# Patient Record
Sex: Female | Born: 1985 | Race: White | Hispanic: No | Marital: Single | Smoking: Never smoker
Health system: Southern US, Community
[De-identification: ages and names within clinical notes are randomized; demographics above are authoritative.]

## PROBLEM LIST (undated history)

## (undated) HISTORY — PX: NO PAST SURGERIES: SHX2092

---

## 2003-01-23 ENCOUNTER — Inpatient Hospital Stay (HOSPITAL_COMMUNITY): Admission: AD | Admit: 2003-01-23 | Discharge: 2003-01-25 | Payer: Self-pay | Admitting: Obstetrics and Gynecology

## 2008-01-06 ENCOUNTER — Other Ambulatory Visit: Admission: RE | Admit: 2008-01-06 | Discharge: 2008-01-06 | Payer: Self-pay | Admitting: Obstetrics and Gynecology

## 2008-04-18 ENCOUNTER — Emergency Department (HOSPITAL_COMMUNITY): Admission: EM | Admit: 2008-04-18 | Discharge: 2008-04-18 | Payer: Self-pay | Admitting: Emergency Medicine

## 2011-02-02 NOTE — H&P (Signed)
NAME:  Sara Newman, Sara Newman                       ACCOUNT NO.:  [id number]   MEDICAL RECORD NO.:  [id number]                   PATIENT TYPE:  INP   LOCATION:  A428                                 FACILITY:  [facility]   PHYSICIAN:  Langley Gauss, M.D.                DATE OF BIRTH:  Feb 26, 1986   DATE OF ADMISSION:  01/23/2003  DATE OF DISCHARGE:                                HISTORY & PHYSICAL   HISTORY OF PRESENT ILLNESS:  This is an 25 year old gravida 1, para 0 at 40+  weeks gestation who presents to [facility] in early stages of  labor.  The patient was initially examined by the nursing staff at 1800 at  which time she is noted to be dilated 3 cm with uterine contractions of  every 3-5 minutes and intact membranes.   The patient presented at [facility] with increase in frequency and  intensity of uterine contractions throughout the day.  She denies any  vaginal bleeding or any history which would be consistent with rupture of  membranes.  The patient's prenatal course has been uncomplicated.  She has  received prenatal care through the office of Dr. Christin Bach.  She  reportedly had a negative benign prenatal course. She is noted to be GBS  negative.  A 3-hour glucose tolerance test was within normal limits.   PAST MEDICAL HISTORY:  The patient has no other medical or surgical  illnesses.  She has never required hospitalization.  She does have an  abnormal ASCUS Pap smear with the recommendation that this Pap smear be  repeated 3 months postdelivery.   ALLERGIES:  The patient has no known drug allergies.   PHYSICAL EXAMINATION:  GENERAL:  A pleasant, white female, 25 years old.  VITAL SIGNS:  The blood pressure is 117/73, pulse rate of 68, respiratory  rate is 20, temperature is 97.9.  HEENT:  Negative.  NECK:  No adenopathy.  Neck is supple.  Thyroid is not palpable.  LUNGS:  Clear.  CARDIOVASCULAR:  Exam is regular rate and rhythm.  ABDOMEN:  The abdomen is  soft and nontender.  No surgical scars are  identified.  The patient is vertex presentation by Leopold's maneuvers  EXTREMITIES:  Extremities are noted to be normal.  PELVIC:  No vaginal bleeding or leakage of fluid.  External genitalia within  normal limits.  Visual examination reveals the cervix to be 3 cm dilated  with bulging, intact membranes per the nursing staff.  There is a reassuring  fetal heart rate by an external fetal monitor and contracting every 3-5  minutes.   ASSESSMENT:  A 40+ week intrauterine pregnancy now presenting in the early  stages of labor.  We will proceed with amniotomy; thereafter, augment or  induce with Pitocin as clinically indicated  The patient will be observed  for active labor after amniotomy.  The patient is interested in placement of  an epidural during the course of labor.                                               Langley Gauss, M.D.   DC/MEDQ  D:  01/24/2003  T:  01/24/2003  Job:  [id number]

## 2011-02-02 NOTE — Op Note (Signed)
NAME:  Sara Newman, SOHAIL NO.:  [id number]   MEDICAL RECORD NO.:  [id number]                  PATIENT TYPE:   LOCATION:                                       FACILITY:   PHYSICIAN:  Langley Gauss, M.D.                DATE OF BIRTH:   DATE OF PROCEDURE:  DATE OF DISCHARGE:                                 OPERATIVE REPORT   DIAGNOSES:  A 40+ week intrauterine pregnancy presenting in labor.   PROCEDURES PERFORMED:  1. Placement of continuous lumbar epidural analgesia.  2. Spontaneous assisted vaginal delivery of a 7 pound 14 ounce female     infant.  3. Midline episiotomy repair.   Delivery performed by Dr. Roylene Reason. Lisette Grinder.   ESTIMATED BLOOD LOSS:  Less than 500 cc.   SPECIMENS:  1. Arterial cord gas and cord blood to pathology.  2. Placenta is examined and noted to be apparently intake with a 3-vessel     umbilical cord.   SUMMARY:  The patient had been admitted in active labor with rapid  progression through the course of labor.  The patient requested an epidural  analgesic which was placed without difficulty and provided excellent  bilateral relief.  The patient subsequently reached complete dilatation. She  had such an excellent epidural block in place that she had no urge to push  nor was she able to perceive her contractions at all.  Thus, the epidural  pump was turned off until the patient began having return of sensation to  push.   The patient is, thus, placed in the dorsal lithotomy position.  At that time  the Foley catheter is removed.  The patient did have a terminal deceleration  of the fetal heart rate to 60 beats/min times the final 4 minutes.  With  distention of the perineum a small midline episiotomy is performed.  The  infant is delivered in an OA position with the midline episiotomy without  extension.  The mouth and nares were bulb suctioned of clear amniotic fluid.  Renewed expulsive efforts resulted in a spontaneous  rotation to a right  anterior shoulder position.  Gentle downward traction combined with  expulsive efforts resulted in delivery of this shoulder beneath the pubic  symphysis without difficulty.  The remainder of the infant likewise  delivered without difficulty.  The umbilical cord milked towards the infant.  The cord was doubly clamped, and cut.  The infant was placed on the maternal  abdomen for immediate bonding purposes.  Arterial cord gases and cord blood  then obtained.   Gentle traction of the umbilical cord results in separation which, upon  examination, appears to be an intact 3-vessel cord.  Examination of the  genital tract reveals the midline episiotomy. This did not extend.  This was  easily repaired  utilizing #0 chromic in a running lock suture on the vaginal mucosa and a 2-  layer  closure of #0 chromic on the perineal body.  The patient tolerated the  procedure well.  She was taken out of the dorsal lithotomy position and  rolled to her side at which time the epidural catheter is removed with the  blue tip noted to be intact.                                               Langley Gauss, M.D.    DC/MEDQ  D:  01/24/2003  T:  01/24/2003  Job:  [id number]

## 2011-02-02 NOTE — Op Note (Signed)
   NAME:  Sara Newman, Sara Newman NO.:  [id number]   MEDICAL RECORD NO.:  [id number]                  PATIENT TYPE:   LOCATION:                                       FACILITY:   PHYSICIAN:  Langley Gauss, M.D.                DATE OF BIRTH:   DATE OF PROCEDURE:  DATE OF DISCHARGE:                                 OPERATIVE REPORT   OBSTETRICAL PROCEDURE NOTE:   PROCEDURE:  Placement of continuous lumbar epidural analgesia at the L3-L4  interspace performed by Dr. Roylene Reason. Lisette Grinder.   COMPLICATIONS:  None.   SUMMARY:  An appropriate and informed consent obtained.  Continuous  electronic fetal monitoring performed. The patient was placed in the seated  position; bony landmarks were identified.  The L3-L4 interspace was chosen.  The patient's back is sterilely prepped and draped utilizing the epidural  kit; 5 cc of 1% lidocaine plain injected at the midline of the L3-L4  interspace to raise this site to raise a small skin wheal.   The 17-gauge Touhy-Schliff needle was then utilized with loss of resistance  in air-filled glass syringe to identify entry into the epidural space on the  first attempt without difficulty.  Initial test dose of 5 cc of 1.5%  lidocaine plus epinephrine injected through the epidural needle.  No signs  of CSF or intravascular injection obtained.  Epidural catheter is then  inserted to a depth of 5 cm; needle is removed.  Aspiration test is  negative.  Second test dose of 2 cc of 1.5% lidocaine plus epinephrine  injected through the epidural catheter.  Again, no signs of CSF or  intravascular injection obtained.   Thus, the patient is connected to the infusion pump, containing the standard  mixture.  She will be treated with a 10 cc bolus followed by continuous  infusion rate of 14 cc/hour.  Upon return to the left lateral supine  position the patient does have evidence of a bilateral epidural block  setting up with good relief for  labor analgesia.   The patient had been examined immediately prior to placement of the epidural  and noted to be 6 cm dilated, completely effaced and 0 station with  reassuring fetal heart rate.  Amniotomy had been performed with findings of  clear amniotic fluid and a fetal scalp electrode placed.                                               Langley Gauss, M.D.    DC/MEDQ  D:  01/23/2003  T:  01/24/2003  Job:  [id number]   cc:   [facility]

## 2011-06-04 ENCOUNTER — Other Ambulatory Visit: Payer: Self-pay | Admitting: Family Medicine

## 2011-06-04 ENCOUNTER — Ambulatory Visit (HOSPITAL_COMMUNITY)
Admission: RE | Admit: 2011-06-04 | Discharge: 2011-06-04 | Disposition: A | Discharge status: Home / Self Care | Payer: Medicaid Other | Source: Ambulatory Visit | Attending: Family Medicine | Admitting: Family Medicine

## 2011-06-04 DIAGNOSIS — M7730 Calcaneal spur, unspecified foot: Secondary | ICD-10-CM | POA: Insufficient documentation

## 2011-06-04 DIAGNOSIS — S99929A Unspecified injury of unspecified foot, initial encounter: Secondary | ICD-10-CM | POA: Insufficient documentation

## 2011-06-04 DIAGNOSIS — R52 Pain, unspecified: Secondary | ICD-10-CM

## 2011-06-04 DIAGNOSIS — M25579 Pain in unspecified ankle and joints of unspecified foot: Secondary | ICD-10-CM | POA: Insufficient documentation

## 2011-06-04 DIAGNOSIS — S8990XA Unspecified injury of unspecified lower leg, initial encounter: Secondary | ICD-10-CM | POA: Insufficient documentation

## 2011-06-04 DIAGNOSIS — X500XXA Overexertion from strenuous movement or load, initial encounter: Secondary | ICD-10-CM | POA: Insufficient documentation

## 2016-10-12 ENCOUNTER — Ambulatory Visit
Admission: EM | Admit: 2016-10-12 | Discharge: 2016-10-12 | Disposition: A | Discharge status: Home / Self Care | Payer: Managed Care, Other (non HMO) | Attending: Family Medicine | Admitting: Family Medicine

## 2016-10-12 DIAGNOSIS — N3000 Acute cystitis without hematuria: Secondary | ICD-10-CM | POA: Diagnosis not present

## 2016-10-12 LAB — URINALYSIS, COMPLETE (UACMP) WITH MICROSCOPIC
GLUCOSE, UA: NEGATIVE mg/dL
Leukocytes, UA: NEGATIVE
Nitrite: POSITIVE — AB
pH: 5 (ref 5.0–8.0)

## 2016-10-12 MED ORDER — NITROFURANTOIN MONOHYD MACRO 100 MG PO CAPS: 100.0000 mg | ORAL_CAPSULE | Freq: Two times a day (BID) | ORAL | 0 refills | Status: DC

## 2016-10-12 NOTE — ED Triage Notes (Signed)
Patient states that she started UTI symptoms x 1 month. Patient states that she took AZO mulitple times and still came back. Patient states that she has burning with urination and frequency. Patient states that she has also had some lower abdominal pain.

## 2016-10-12 NOTE — ED Provider Notes (Signed)
[facility]    CSN: [id number] Arrival date & time: 10/12/16  1327     History   Chief Complaint Chief Complaint  Patient presents with  . Urinary Tract Infection    HPI Sara Newman is a 31 y.o. female.    Urinary Tract Infection  Pain quality:  Burning Pain severity:  Mild Onset quality:  Sudden Timing:  Constant Progression:  Waxing and waning Chronicity:  New Recent urinary tract infections: no   Relieved by:  Nothing Ineffective treatments:  Phenazopyridine and cranberry juice Urinary symptoms: discolored urine and frequent urination   Associated symptoms: abdominal pain (mild; suprapubic)   Associated symptoms: no fever, no flank pain, no genital lesions, no nausea, no vaginal discharge and no vomiting   Risk factors: no hx of pyelonephritis, no hx of urolithiasis, no kidney transplant, not pregnant, no recurrent urinary tract infections, no renal cysts, no renal disease, no single kidney and no urinary catheter     History reviewed. No pertinent past medical history.  There are no active problems to display for this patient.   Past Surgical History:  Procedure Laterality Date  . NO PAST SURGERIES      OB History    No data available       Home Medications    Prior to Admission medications   Medication Sig Start Date End Date Taking? Authorizing Provider  nitrofurantoin, macrocrystal-monohydrate, (MACROBID) 100 MG capsule Take 1 capsule (100 mg total) by mouth 2 (two) times daily. 10/12/16   Payton Mccallumrlando Felipe Cabell, MD    Family History History reviewed. No pertinent family history.  Social History Social History  Substance Use Topics  . Smoking status: Never Smoker  . Smokeless tobacco: Never Used  . Alcohol use Yes     Allergies   Patient has no known allergies.   Review of Systems Review of Systems  Constitutional: Negative for fever.  Gastrointestinal: Positive for abdominal pain (mild; suprapubic). Negative for nausea and  vomiting.  Genitourinary: Negative for flank pain and vaginal discharge.     Physical Exam Triage Vital Signs ED Triage Vitals  Enc Vitals Group     BP 10/12/16 1414 113/77     Pulse Rate 10/12/16 1414 87     Resp 10/12/16 1414 18     Temp 10/12/16 1414 98.6 F (37 C)     Temp Source 10/12/16 1414 Oral     SpO2 10/12/16 1414 100 %     Weight 10/12/16 1406 117 lb (53.1 kg)     Height 10/12/16 1406 5\' 10"  (1.778 m)     Head Circumference --      Peak Flow --      Pain Score 10/12/16 1414 3     Pain Loc --      Pain Edu? --      Excl. in GC? --    No data found.   Updated Vital Signs BP 113/77 (BP Location: Left Arm)   Pulse 87   Temp 98.6 F (37 C) (Oral)   Resp 18   Ht 5\' 10"  (1.778 m)   Wt 117 lb (53.1 kg)   LMP 09/25/2015   SpO2 100%   BMI 16.79 kg/m   Visual Acuity Right Eye Distance:   Left Eye Distance:   Bilateral Distance:    Right Eye Near:   Left Eye Near:    Bilateral Near:     Physical Exam  Constitutional: She appears well-developed and well-nourished. No distress.  Abdominal:  Soft. Bowel sounds are normal. She exhibits no distension and no mass. There is tenderness (mild; suprapubic). There is no rebound and no guarding.  Skin: She is not diaphoretic.  Nursing note and vitals reviewed.    UC Treatments / Results  Labs (all labs ordered are listed, but only abnormal results are displayed) Labs Reviewed  URINALYSIS, COMPLETE (UACMP) WITH MICROSCOPIC - Abnormal; Notable for the following:       Result Value   APPearance HAZY (*)    Specific Gravity, Urine >1.030 (*)    Hgb urine dipstick TRACE (*)    Bilirubin Urine SMALL (*)    Ketones, ur TRACE (*)    Protein, ur TRACE (*)    Nitrite POSITIVE (*)    Squamous Epithelial / LPF 0-5 (*)    Bacteria, UA MANY (*)    All other components within normal limits    EKG  EKG Interpretation None       Radiology No results found.  Procedures Procedures (including critical care  time)  Medications Ordered in UC Medications - No data to display   Initial Impression / Assessment and Plan / UC Course  I have reviewed the triage vital signs and the nursing notes.  Pertinent labs & imaging results that were available during my care of the patient were reviewed by me and considered in my medical decision making (see chart for details).       Final Clinical Impressions(s) / UC Diagnoses   Final diagnoses:  Acute cystitis without hematuria    New Prescriptions Discharge Medication List as of 10/12/2016  3:04 PM    START taking these medications   Details  nitrofurantoin, macrocrystal-monohydrate, (MACROBID) 100 MG capsule Take 1 capsule (100 mg total) by mouth 2 (two) times daily., Starting Fri 10/12/2016, Normal       1. Lab results and diagnosis reviewed with patient 2. rx as per orders above; reviewed possible side effects, interactions, risks and benefits  3. Recommend supportive treatment with increased water intake 4. Follow-up prn if symptoms worsen or don't improve   Payton Mccallum, MD 10/12/16 1520

## 2020-04-05 ENCOUNTER — Other Ambulatory Visit: Payer: Self-pay

## 2020-04-05 ENCOUNTER — Encounter: Payer: Self-pay | Admitting: Emergency Medicine

## 2020-04-05 ENCOUNTER — Ambulatory Visit
Admission: EM | Admit: 2020-04-05 | Discharge: 2020-04-05 | Disposition: A | Discharge status: Home / Self Care | Payer: 59 | Attending: Emergency Medicine | Admitting: Emergency Medicine

## 2020-04-05 DIAGNOSIS — Z202 Contact with and (suspected) exposure to infections with a predominantly sexual mode of transmission: Secondary | ICD-10-CM | POA: Diagnosis present

## 2020-04-05 DIAGNOSIS — Z113 Encounter for screening for infections with a predominantly sexual mode of transmission: Secondary | ICD-10-CM | POA: Diagnosis present

## 2020-04-05 DIAGNOSIS — N760 Acute vaginitis: Secondary | ICD-10-CM | POA: Insufficient documentation

## 2020-04-05 DIAGNOSIS — B9689 Other specified bacterial agents as the cause of diseases classified elsewhere: Secondary | ICD-10-CM | POA: Diagnosis not present

## 2020-04-05 LAB — WET PREP, GENITAL
Sperm: NONE SEEN
Trich, Wet Prep: NONE SEEN
Yeast Wet Prep HPF POC: NONE SEEN

## 2020-04-05 LAB — CHLAMYDIA/NGC RT PCR (ARMC ONLY)
Chlamydia Tr: NOT DETECTED
N gonorrhoeae: NOT DETECTED

## 2020-04-05 LAB — HIV ANTIBODY (ROUTINE TESTING W REFLEX): HIV Screen 4th Generation wRfx: NONREACTIVE

## 2020-04-05 MED ORDER — METRONIDAZOLE 500 MG PO TABS: 500.0000 mg | ORAL_TABLET | Freq: Two times a day (BID) | ORAL | 0 refills | Status: AC

## 2020-04-05 MED ORDER — DOXYCYCLINE HYCLATE 100 MG PO CAPS: 100.0000 mg | ORAL_CAPSULE | Freq: Two times a day (BID) | ORAL | 0 refills | Status: AC

## 2020-04-05 NOTE — ED Triage Notes (Signed)
Pt present to [facility] for STD testing she states her partner told her that he tested positive for Chlamydia. She state she is having a white discharge. Started about a week ago.

## 2020-04-05 NOTE — Discharge Instructions (Signed)
Take medication as prescribed.  No sexual activity for at least 1 week.  Follow-up in 1 week.  Inform all partners.  Monitor.  Follow up with your primary care physician this week as needed. Return to Urgent care for new or worsening concerns.

## 2020-04-05 NOTE — ED Provider Notes (Signed)
[facility] ____________________________________________  Time seen: Approximately 12:12 PM  I have reviewed the triage vital signs and the nursing notes.   HISTORY  Chief Complaint Exposure to STD   HPI Sara Newman is a 34 y.o. female presenting for evaluation of STD potential exposure as well as white vaginal discharge.  Patient reports for the last 2 days she has been having whitish vaginal discharge, nonodorous and nonpainful.  States she has had 2 recent sexual partners, 1 in which called her yesterday and told her that he tested positive for chlamydia. States last external course without partner was greater than 1 week ago and condoms were not used.  1 other sexual partner recent and condoms were used.  Patient reports she has an IUD, declines concerns of pregnancy.  States feels well at this time.  Denies dysuria, abdominal pain, back pain, fevers or recent sickness.  Patient consents to gonorrhea, chlamydia, wet prep as well as HIV and RPR.  Declines other further testing.  Reports otherwise doing well.  Denies aggravating or alleviating factors.  No LMP recorded. (Menstrual status: IUD).    History reviewed. No pertinent past medical history.  There are no problems to display for this patient.   Past Surgical History:  Procedure Laterality Date   NO PAST SURGERIES       No current facility-administered medications for this encounter.  Current Outpatient Medications:    doxycycline (VIBRAMYCIN) 100 MG capsule, Take 1 capsule (100 mg total) by mouth 2 (two) times daily., Disp: 14 capsule, Rfl: 0   metroNIDAZOLE (FLAGYL) 500 MG tablet, Take 1 tablet (500 mg total) by mouth 2 (two) times daily., Disp: 14 tablet, Rfl: 0  Allergies Patient has no known allergies.  Family History  Problem Relation Age of Onset   Diabetes Mother    Healthy Father     Social History Social History   Tobacco Use   Smoking status: Never Smoker   Smokeless  tobacco: Never Used  Building services engineer Use: Never used  Substance Use Topics   Alcohol use: Yes   Drug use: No    Review of Systems Constitutional: No fever ENT: No sore throat. Cardiovascular: Denies chest pain. Respiratory: Denies shortness of breath. Gastrointestinal: No abdominal pain.  No nausea, no vomiting.  No diarrhea.  Genitourinary: Negative for dysuria. As above.  Musculoskeletal: Negative for back pain. Skin: Negative for rash.   ____________________________________________   PHYSICAL EXAM:  VITAL SIGNS: ED Triage Vitals  Enc Vitals Group     BP 04/05/20 1042 109/81     Pulse Rate 04/05/20 1042 (!) 58     Resp 04/05/20 1042 18     Temp 04/05/20 1042 98.2 F (36.8 C)     Temp Source 04/05/20 1042 Oral     SpO2 04/05/20 1042 100 %     Weight 04/05/20 1039 117 lb 1 oz (53.1 kg)     Height 04/05/20 1039 5\' 10"  (1.778 m)     Head Circumference --      Peak Flow --      Pain Score 04/05/20 1039 0     Pain Loc --      Pain Edu? --      Excl. in GC? --     Constitutional: Alert and oriented. Well appearing and in no acute distress. Eyes: Conjunctivae are normal.  ENT      Head: Normocephalic and atraumatic. Cardiovascular: Normal rate, regular rhythm. Grossly normal heart sounds.  Good  peripheral circulation. Respiratory: Normal respiratory effort without tachypnea nor retractions. Breath sounds are clear and equal bilaterally. No wheezes, rales, rhonchi. Gastrointestinal: Soft and nontender.No CVA tenderness. Musculoskeletal:No midline cervical, thoracic or lumbar tenderness to palpation.  Neurologic:  Normal speech and language. Speech is normal. No gait instability.  Skin:  Skin is warm, dry and intact. No rash noted. Psychiatric: Mood and affect are normal. Speech and behavior are normal. Patient exhibits appropriate insight and judgment   ___________________________________________   LABS (all labs ordered are listed, but only abnormal results  are displayed)  Labs Reviewed  WET PREP, GENITAL - Abnormal; Notable for the following components:      Result Value   Clue Cells Wet Prep HPF POC PRESENT (*)    WBC, Wet Prep HPF POC MODERATE (*)    All other components within normal limits  CHLAMYDIA/NGC RT PCR ([facility] ONLY)  HIV ANTIBODY (ROUTINE TESTING W REFLEX)  RPR     PROCEDURES Procedures    INITIAL IMPRESSION / ASSESSMENT AND PLAN / ED COURSE  Pertinent labs & imaging results that were available during my care of the patient were reviewed by me and considered in my medical decision making (see chart for details).  Well-appearing patient.  Patient elected for self wet prep and self GC chlamydia.  Wet prep positive for clue cells.  Patient reports potential exposure to chlamydia.  Will treat with oral doxycycline, and oral Flagyl.  Over-the-counter probiotics, rest, fluids.  Discussed no sexual activity for at least 1 week and to follow-up with primary care health department.  Inform all partners.Discussed indication, risks and benefits of medications with patient.   Discussed follow up with Primary care physician this week. Discussed follow up and return parameters including no resolution or any worsening concerns. Patient verbalized understanding and agreed to plan.   ____________________________________________   FINAL CLINICAL IMPRESSION(S) / ED DIAGNOSES  Final diagnoses:  Bacterial vaginosis  Possible exposure to STD  Screen for STD (sexually transmitted disease)     ED Discharge Orders         Ordered    metroNIDAZOLE (FLAGYL) 500 MG tablet  2 times daily     Discontinue  Reprint     04/05/20 1121    doxycycline (VIBRAMYCIN) 100 MG capsule  2 times daily     Discontinue  Reprint     04/05/20 1121           Note: This dictation was prepared with Dragon dictation along with smaller phrase technology. Any transcriptional errors that result from this process are unintentional.           Renford Dills, NP 04/05/20 1218

## 2020-04-06 LAB — RPR: RPR Ser Ql: NONREACTIVE

## 2020-06-04 ENCOUNTER — Encounter: Payer: Self-pay | Admitting: Emergency Medicine

## 2020-06-04 ENCOUNTER — Other Ambulatory Visit: Payer: Self-pay

## 2020-06-04 ENCOUNTER — Ambulatory Visit
Admission: EM | Admit: 2020-06-04 | Discharge: 2020-06-04 | Disposition: A | Discharge status: Home / Self Care | Payer: 59 | Attending: Emergency Medicine | Admitting: Emergency Medicine

## 2020-06-04 ENCOUNTER — Ambulatory Visit (INDEPENDENT_AMBULATORY_CARE_PROVIDER_SITE_OTHER): Payer: 59

## 2020-06-04 DIAGNOSIS — S93402A Sprain of unspecified ligament of left ankle, initial encounter: Secondary | ICD-10-CM | POA: Diagnosis not present

## 2020-06-04 DIAGNOSIS — S99912A Unspecified injury of left ankle, initial encounter: Secondary | ICD-10-CM

## 2020-06-04 DIAGNOSIS — M25472 Effusion, left ankle: Secondary | ICD-10-CM | POA: Diagnosis not present

## 2020-06-04 MED ORDER — IBUPROFEN 800 MG PO TABS: 800.0000 mg | ORAL_TABLET | Freq: Three times a day (TID) | ORAL | 0 refills | Status: AC | PRN

## 2020-06-04 MED ORDER — IBUPROFEN 800 MG PO TABS
800.0000 mg | ORAL_TABLET | Freq: Once | ORAL | Status: AC
  Administered 2020-06-04: 800 mg via ORAL

## 2020-06-04 NOTE — Discharge Instructions (Signed)
Ice, elevation, use of compression, to help with pain.  May use the boot we have provided until pain is more tolerable.  Advance activity as tolerated.  Ibuprofen eery 8 hours as needed.  Follow up with sports medicine or orthopedics as needed for persistent concerns.

## 2020-06-04 NOTE — ED Triage Notes (Signed)
Patient states that about an hour ago she twisted her left ankle while running this morning.  Patient c/o swelling and pain in her left ankle.

## 2020-06-04 NOTE — ED Provider Notes (Signed)
[facility]    CSN: [id number] Arrival date & time: 06/04/20  1102      History   Chief Complaint Chief Complaint  Patient presents with  . Fall  . Ankle Pain    left    HPI Sara Newman is a 34 y.o. female.   Sara Newman presents with complaints of left lateral ankle pain after a mis-step while jogging on a trail today. Pain and swelling since. Was able to walk, assisted, back to vehicle, but hasn't been weight bearing since. Denies any previous similar. Hasn't taken any medications for pain.    ROS per HPI, negative if not otherwise mentioned.      History reviewed. No pertinent past medical history.  There are no problems to display for this patient.   Past Surgical History:  Procedure Laterality Date  . NO PAST SURGERIES      OB History   No obstetric history on file.      Home Medications    Prior to Admission medications   Medication Sig Start Date End Date Taking? Authorizing Provider  levonorgestrel (MIRENA) 20 MCG/24HR IUD 1 each by Intrauterine route once.   Yes [provider]  doxycycline (VIBRAMYCIN) 100 MG capsule Take 1 capsule (100 mg total) by mouth 2 (two) times daily. 04/05/20   Renford Dills, NP  ibuprofen (ADVIL) 800 MG tablet Take 1 tablet (800 mg total) by mouth every 8 (eight) hours as needed. 06/04/20   Georgetta Haber, NP  metroNIDAZOLE (FLAGYL) 500 MG tablet Take 1 tablet (500 mg total) by mouth 2 (two) times daily. 04/05/20   Renford Dills, NP    Family History Family History  Problem Relation Age of Onset  . Diabetes Mother   . Healthy Father     Social History Social History   Tobacco Use  . Smoking status: Never Smoker  . Smokeless tobacco: Never Used  Vaping Use  . Vaping Use: Never used  Substance Use Topics  . Alcohol use: Yes  . Drug use: No     Allergies   Patient has no known allergies.   Review of Systems Review of Systems   Physical Exam Triage Vital Signs ED  Triage Vitals  Enc Vitals Group     BP 06/04/20 1112 121/85     Pulse Rate 06/04/20 1112 77     Resp 06/04/20 1112 14     Temp 06/04/20 1112 98.1 F (36.7 C)     Temp Source 06/04/20 1112 Oral     SpO2 06/04/20 1112 100 %     Weight 06/04/20 1109 137 lb (62.1 kg)     Height 06/04/20 1109 5\' 10"  (1.778 m)     Head Circumference --      Peak Flow --      Pain Score 06/04/20 1109 2     Pain Loc --      Pain Edu? --      Excl. in GC? --    No data found.  Updated Vital Signs BP 121/85 (BP Location: Left Arm)   Pulse 77   Temp 98.1 F (36.7 C) (Oral)   Resp 14   Ht 5\' 10"  (1.778 m)   Wt 137 lb (62.1 kg)   SpO2 100%   BMI 19.66 kg/m   Visual Acuity Right Eye Distance:   Left Eye Distance:   Bilateral Distance:    Right Eye Near:   Left Eye Near:    Bilateral Near:  Physical Exam Constitutional:      General: She is not in acute distress.    Appearance: She is well-developed.  Cardiovascular:     Rate and Rhythm: Normal rate.  Pulmonary:     Effort: Pulmonary effort is normal.  Musculoskeletal:     Left ankle: Swelling present. No lacerations. Tenderness present over the lateral malleolus and ATF ligament. Decreased range of motion.  Skin:    General: Skin is warm and dry.  Neurological:     Mental Status: She is alert and oriented to person, place, and time.      UC Treatments / Results  Labs (all labs ordered are listed, but only abnormal results are displayed) Labs Reviewed - No data to display  EKG   Radiology DG Ankle Complete Left  Result Date: 06/04/2020 CLINICAL DATA:  34 year old female with a history of ankle swelling after injury EXAM: LEFT ANKLE COMPLETE - 3+ VIEW COMPARISON:  None. FINDINGS: Significant soft tissue swelling on the lateral ankle. Swelling on the lateral view. Ankle mortise is congruent. No acute displaced fracture is identified. No radiopaque foreign body. IMPRESSION: Significant soft tissue swelling on the lateral ankle  without fracture identified. Electronically Signed   By: Gilmer Mor D.O.   On: 06/04/2020 11:42    Procedures Procedures (including critical care time)  Medications Ordered in UC Medications  ibuprofen (ADVIL) tablet 800 mg (800 mg Oral Given 06/04/20 1134)    Initial Impression / Assessment and Plan / UC Course  I have reviewed the triage vital signs and the nursing notes.  Pertinent labs & imaging results that were available during my care of the patient were reviewed by me and considered in my medical decision making (see chart for details).     Ankle sprain. Cam walker and crutches provided and encouraged activity as tolerated. Pain management discussed. Follow up recommendations provided. Patient verbalized understanding and agreeable to plan.   Final Clinical Impressions(s) / UC Diagnoses   Final diagnoses:  Sprain of left ankle, unspecified ligament, initial encounter     Discharge Instructions     Ice, elevation, use of compression, to help with pain.  May use the boot we have provided until pain is more tolerable.  Advance activity as tolerated.  Ibuprofen eery 8 hours as needed.  Follow up with sports medicine or orthopedics as needed for persistent concerns.     ED Prescriptions    Medication Sig Dispense Auth. Provider   ibuprofen (ADVIL) 800 MG tablet Take 1 tablet (800 mg total) by mouth every 8 (eight) hours as needed. 30 tablet Georgetta Haber, NP     PDMP not reviewed this encounter.   Georgetta Haber, NP 06/04/20 1301

## 2021-04-05 ENCOUNTER — Other Ambulatory Visit: Payer: Self-pay

## 2021-04-05 ENCOUNTER — Ambulatory Visit
Admission: EM | Admit: 2021-04-05 | Discharge: 2021-04-05 | Disposition: A | Discharge status: Home / Self Care | Payer: 59 | Attending: Sports Medicine | Admitting: Sports Medicine

## 2021-04-05 DIAGNOSIS — B349 Viral infection, unspecified: Secondary | ICD-10-CM | POA: Diagnosis present

## 2021-04-05 DIAGNOSIS — R519 Headache, unspecified: Secondary | ICD-10-CM | POA: Diagnosis present

## 2021-04-05 DIAGNOSIS — J029 Acute pharyngitis, unspecified: Secondary | ICD-10-CM | POA: Diagnosis present

## 2021-04-05 DIAGNOSIS — R0981 Nasal congestion: Secondary | ICD-10-CM

## 2021-04-05 DIAGNOSIS — R0982 Postnasal drip: Secondary | ICD-10-CM | POA: Diagnosis present

## 2021-04-05 LAB — GROUP A STREP BY PCR: Group A Strep by PCR: NOT DETECTED

## 2021-04-05 NOTE — ED Triage Notes (Signed)
Pt c/o sore throat since Monday.

## 2021-04-05 NOTE — Discharge Instructions (Addendum)
As we discussed, your strep test is negative.  It appears as though it is a viral process. Please see educational handouts. As we discussed I could give you a nasal spray but you declined on that.  He can use over-the-counter meds as needed. Plenty of rest, plenty fluids, Tylenol or Motrin for any discomfort. He can also use throat lozenges and salt water gargles for your sore throat. We discussed a work note but you declined. If symptoms persist please see your primary care provider.

## 2021-04-05 NOTE — ED Provider Notes (Signed)
[facility]    CSN: [id number] Arrival date & time: 04/05/21  1227      History   Chief Complaint Chief Complaint  Patient presents with   Sore Throat    HPI Sara Newman is a 35 y.o. female.   35 year old female who presents for evaluation of 3 days of a sore throat.  She also reports a slight headache.  No fever shakes chills.  No nausea vomiting diarrhea.  No abdominal or urinary symptoms.  She does have a little bit of postnasal drip but no cough.  She has a little bit of a tickle in her throat from the postnasal drip.  She has no history of seasonal allergies or asthma.  No chest pain or shortness of breath.  Generally healthy just takes Vyvanse and birth control.  She was concerned she may have strep throat and comes into the clinic for evaluation.  No sick contacts.  No red flag signs or symptoms elicited on history.   History reviewed. No pertinent past medical history.  There are no problems to display for this patient.   Past Surgical History:  Procedure Laterality Date   NO PAST SURGERIES      OB History   No obstetric history on file.      Home Medications    Prior to Admission medications   Medication Sig Start Date End Date Taking? Authorizing Provider  levonorgestrel (MIRENA) 20 MCG/24HR IUD 1 each by Intrauterine route once.   Yes [provider]  doxycycline (VIBRAMYCIN) 100 MG capsule Take 1 capsule (100 mg total) by mouth 2 (two) times daily. 04/05/20   Renford Dills, NP  ibuprofen (ADVIL) 800 MG tablet Take 1 tablet (800 mg total) by mouth every 8 (eight) hours as needed. 06/04/20   Georgetta Haber, NP  metroNIDAZOLE (FLAGYL) 500 MG tablet Take 1 tablet (500 mg total) by mouth 2 (two) times daily. 04/05/20   Renford Dills, NP  VYVANSE 60 MG capsule Take 60 mg by mouth every morning. 04/03/21   [provider]    Family History Family History  Problem Relation Age of Onset   Diabetes Mother    Healthy Father      Social History Social History   Tobacco Use   Smoking status: Never   Smokeless tobacco: Never  Vaping Use   Vaping Use: Never used  Substance Use Topics   Alcohol use: Yes   Drug use: No     Allergies   Patient has no known allergies.   Review of Systems Review of Systems  Constitutional:  Negative for activity change, appetite change, chills, diaphoresis, fatigue and fever.  HENT:  Positive for congestion, postnasal drip and sore throat. Negative for ear pain, rhinorrhea, sinus pressure, sinus pain, sneezing and trouble swallowing.   Eyes:  Negative for pain.  Respiratory:  Negative for cough, chest tightness and shortness of breath.   Cardiovascular:  Negative for chest pain and palpitations.  Gastrointestinal:  Negative for abdominal pain, diarrhea, nausea and vomiting.  Genitourinary:  Negative for dysuria.  Musculoskeletal:  Negative for back pain, myalgias and neck pain.  Skin:  Negative for color change, pallor, rash and wound.  Neurological:  Positive for headaches. Negative for dizziness and light-headedness.  All other systems reviewed and are negative.   Physical Exam Triage Vital Signs ED Triage Vitals  Enc Vitals Group     BP 04/05/21 1234 110/79     Pulse Rate 04/05/21 1234 85  Resp 04/05/21 1234 16     Temp 04/05/21 1234 98.5 F (36.9 C)     Temp Source 04/05/21 1234 Oral     SpO2 04/05/21 1234 99 %     Weight 04/05/21 1233 140 lb (63.5 kg)     Height --      Head Circumference --      Peak Flow --      Pain Score 04/05/21 1233 2     Pain Loc --      Pain Edu? --      Excl. in GC? --    No data found.  Updated Vital Signs BP 110/79 (BP Location: Left Arm)   Pulse 85   Temp 98.5 F (36.9 C) (Oral)   Resp 16   Wt 63.5 kg   SpO2 99%   BMI 20.09 kg/m   Visual Acuity Right Eye Distance:   Left Eye Distance:   Bilateral Distance:    Right Eye Near:   Left Eye Near:    Bilateral Near:     Physical Exam Vitals and nursing  note reviewed.  Constitutional:      General: She is not in acute distress.    Appearance: Normal appearance. She is well-developed. She is not ill-appearing, toxic-appearing or diaphoretic.  HENT:     Head: Normocephalic and atraumatic.     Right Ear: Tympanic membrane normal.     Left Ear: Tympanic membrane normal.     Nose: Nose normal. No congestion or rhinorrhea.     Mouth/Throat:     Mouth: Mucous membranes are moist. No oral lesions.     Pharynx: Uvula midline. Posterior oropharyngeal erythema present. No pharyngeal swelling, oropharyngeal exudate or uvula swelling.     Tonsils: No tonsillar exudate or tonsillar abscesses. 0 on the right. 0 on the left.  Eyes:     Conjunctiva/sclera: Conjunctivae normal.     Pupils: Pupils are equal, round, and reactive to light.  Cardiovascular:     Rate and Rhythm: Normal rate and regular rhythm.     Pulses: Normal pulses.     Heart sounds: Normal heart sounds. No murmur heard.   No friction rub. No gallop.  Pulmonary:     Effort: Pulmonary effort is normal.     Breath sounds: Normal breath sounds. No stridor. No wheezing, rhonchi or rales.  Musculoskeletal:     Cervical back: Normal range of motion and neck supple.  Skin:    General: Skin is warm and dry.     Capillary Refill: Capillary refill takes less than 2 seconds.  Neurological:     General: No focal deficit present.     Mental Status: She is alert and oriented to person, place, and time.     GCS: GCS eye subscore is 4. GCS verbal subscore is 5. GCS motor subscore is 6.     Cranial Nerves: Cranial nerves are intact.     Sensory: Sensation is intact.     Motor: Motor function is intact.     Coordination: Coordination is intact.     UC Treatments / Results  Labs (all labs ordered are listed, but only abnormal results are displayed) Labs Reviewed  GROUP A STREP BY PCR    EKG   Radiology No results found.  Procedures Procedures (including critical care  time)  Medications Ordered in UC Medications - No data to display  Initial Impression / Assessment and Plan / UC Course  I have reviewed the triage vital signs  and the nursing notes.  Pertinent labs & imaging results that were available during my care of the patient were reviewed by me and considered in my medical decision making (see chart for details).  Clinical impression: 1.  Sore throat for 3 days. 2.  Mild headache with a reassuring neurological exam. 3.  Nasal congestion and postnasal drip  Treatment plan: 1.  The findings and treatment plan were discussed in detail with the patient.  Patient was in agreement. 2.  Recommended getting a strep test.  It was negative.  Is consistent with a viral process. 3.  Educational handouts provided. 4.  Offered her a prescription for Flonase but she declined. 5.  Tylenol or Motrin for any fever or discomfort.  Plenty of rest and plenty of fluids.  Salt water gargles and throat lozenges for sore throat. 6.  Offered a work note but she says she did not need 1.  She works from home. 7.  If symptoms persist then I have asked her to see her PCP. 8.  She was discharged in stable condition and will follow-up here as needed.    Final Clinical Impressions(s) / UC Diagnoses   Final diagnoses:  Sore throat  Headache due to viral infection  Nasal congestion  Post-nasal drainage     Discharge Instructions      As we discussed, your strep test is negative.  It appears as though it is a viral process. Please see educational handouts. As we discussed I could give you a nasal spray but you declined on that.  He can use over-the-counter meds as needed. Plenty of rest, plenty fluids, Tylenol or Motrin for any discomfort. He can also use throat lozenges and salt water gargles for your sore throat. We discussed a work note but you declined. If symptoms persist please see your primary care provider.     ED Prescriptions   None    PDMP not  reviewed this encounter.   Delton See, MD 04/05/21 [phone]

## 2021-11-24 IMAGING — CR DG ANKLE COMPLETE 3+V*L*
3 series · 3 of 3 positions shown · non-contrast
Comparison: None.

CLINICAL DATA: 34-year-old female with a history of ankle swelling
after injury

EXAM:
LEFT ANKLE COMPLETE - 3+ VIEW

[ankle ap]
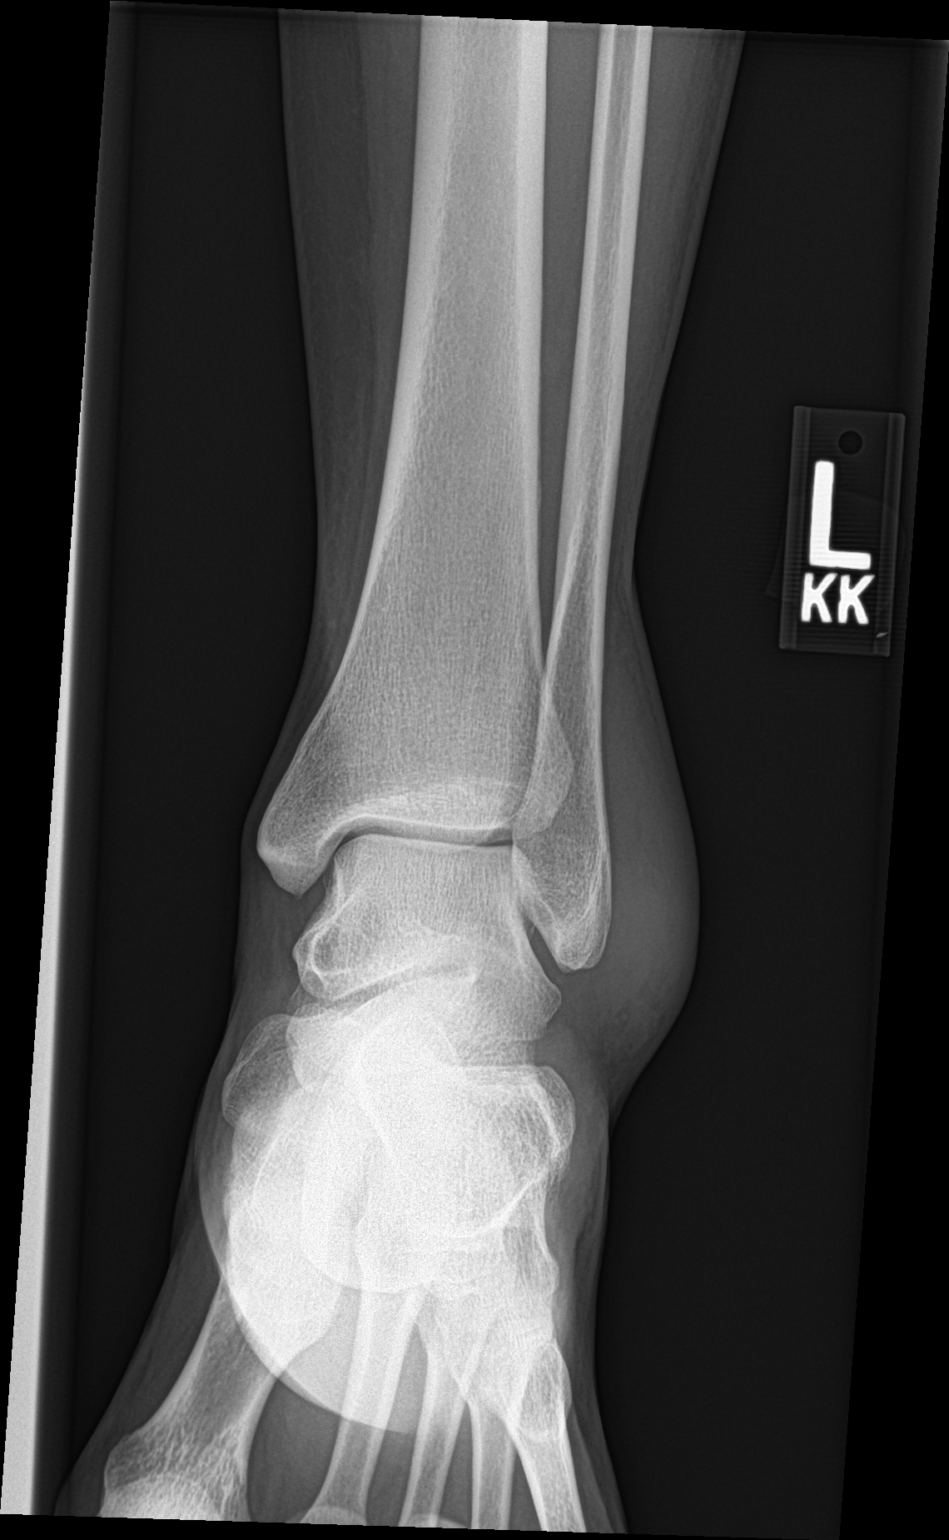

[ankle obl]
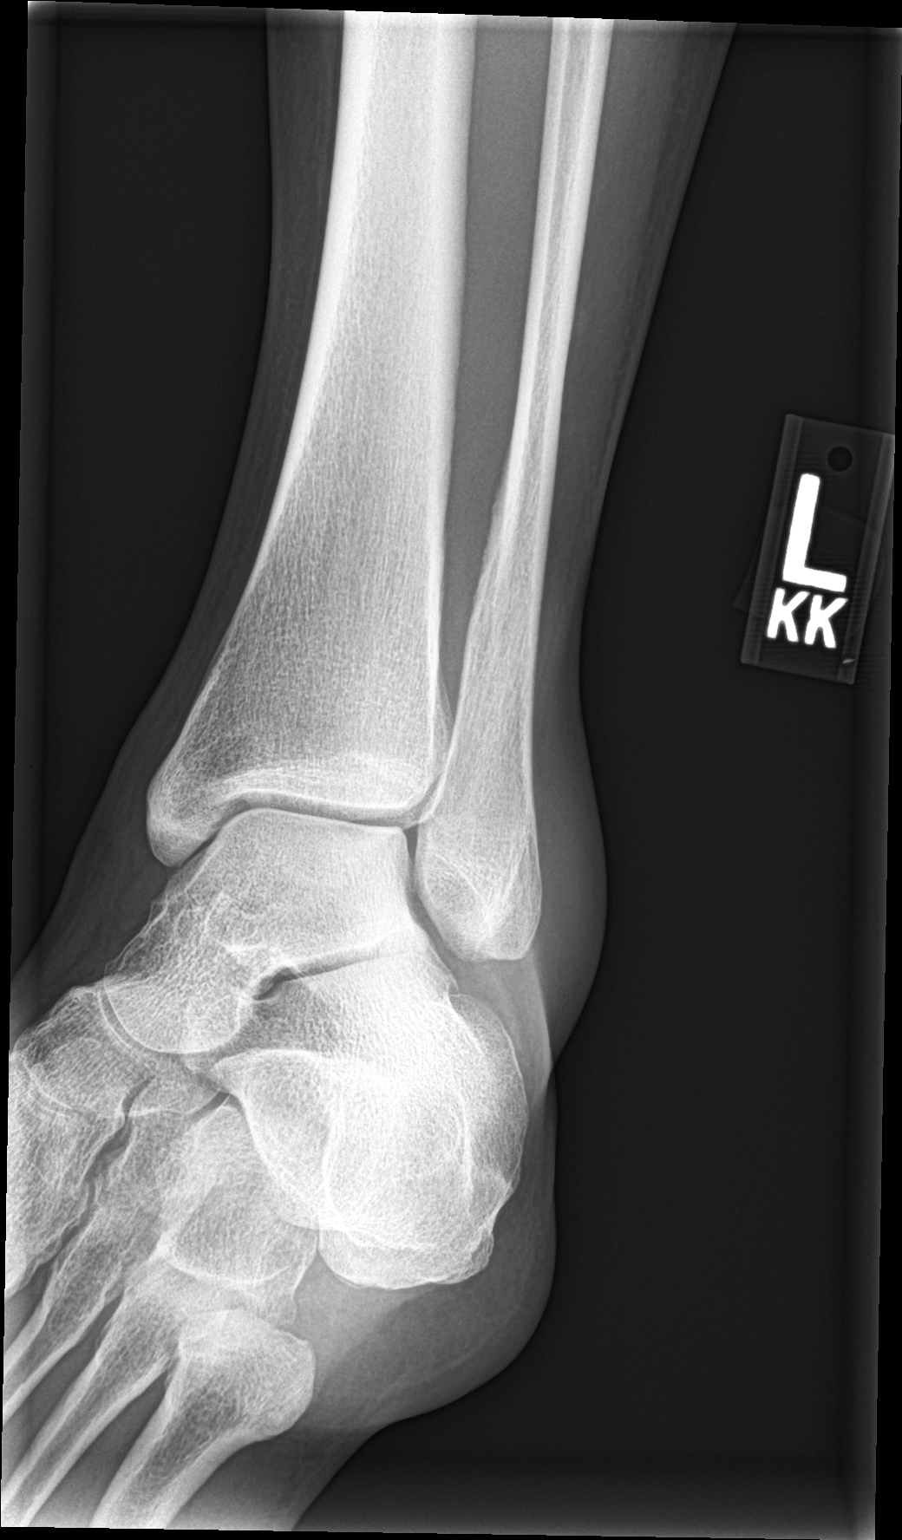

[ankle lat]
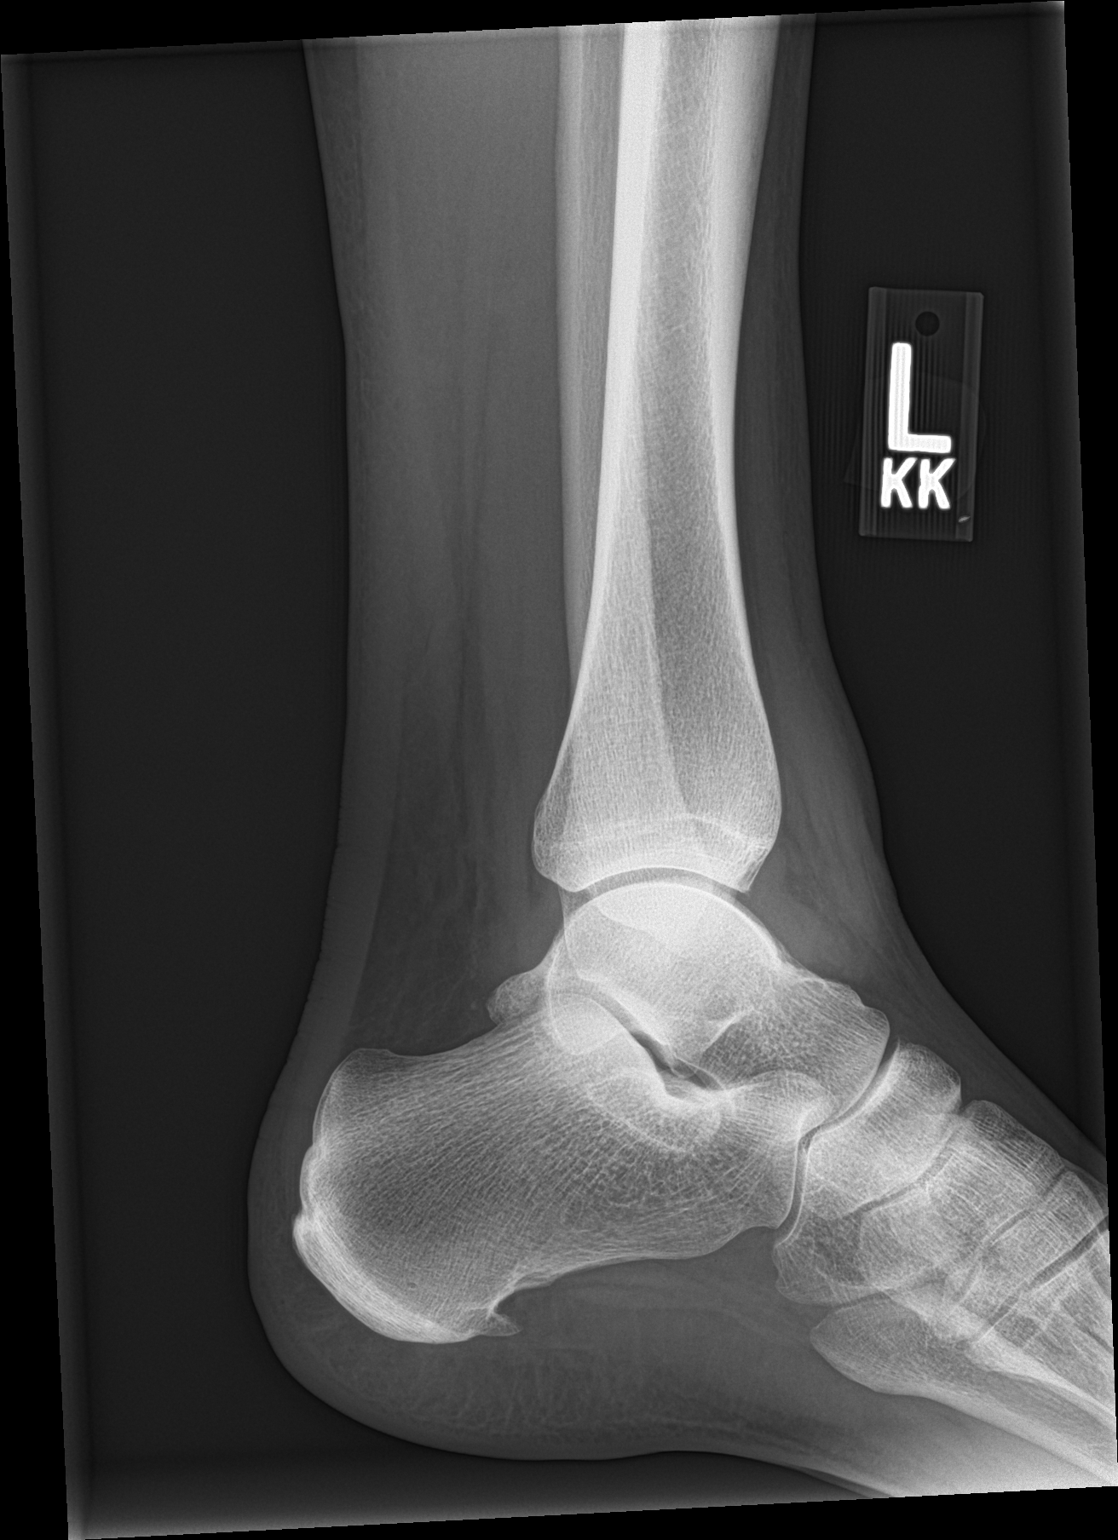

[3 of 3 positions shown; findings below may reference images not displayed]

FINDINGS: Significant soft tissue swelling on the lateral ankle. Swelling on
the lateral view.

Ankle mortise is congruent. No acute displaced fracture is
identified. No radiopaque foreign body.
IMPRESSION: Significant soft tissue swelling on the lateral ankle without
fracture identified.

## 2024-04-29 ENCOUNTER — Other Ambulatory Visit: Payer: Self-pay | Admitting: Medical Genetics

## 2024-07-01 ENCOUNTER — Other Ambulatory Visit: Payer: Self-pay | Admitting: Medical Genetics

## 2024-07-01 DIAGNOSIS — Z006 Encounter for examination for normal comparison and control in clinical research program: Secondary | ICD-10-CM
